# Patient Record
Sex: Male | Born: 1970 | Race: White | Hispanic: No | Marital: Married | Smoking: Current every day smoker
Health system: Southern US, Community
[De-identification: ages and names within clinical notes are randomized; demographics above are authoritative.]

## PROBLEM LIST (undated history)

## (undated) HISTORY — PX: KNEE SURGERY: SHX244

## (undated) HISTORY — PX: OTHER SURGICAL HISTORY: SHX169

---

## 2001-01-05 ENCOUNTER — Encounter: Payer: Self-pay | Admitting: Emergency Medicine

## 2001-01-05 ENCOUNTER — Emergency Department (HOSPITAL_COMMUNITY): Admission: EM | Admit: 2001-01-05 | Discharge: 2001-01-05 | Payer: Self-pay | Admitting: Emergency Medicine

## 2001-07-21 ENCOUNTER — Emergency Department (HOSPITAL_COMMUNITY): Admission: EM | Admit: 2001-07-21 | Discharge: 2001-07-21 | Payer: Self-pay

## 2002-06-02 ENCOUNTER — Emergency Department (HOSPITAL_COMMUNITY): Admission: EM | Admit: 2002-06-02 | Discharge: 2002-06-02 | Payer: Self-pay | Admitting: Emergency Medicine

## 2002-10-28 ENCOUNTER — Emergency Department (HOSPITAL_COMMUNITY): Admission: EM | Admit: 2002-10-28 | Discharge: 2002-10-28 | Payer: Self-pay

## 2003-07-06 ENCOUNTER — Inpatient Hospital Stay (HOSPITAL_COMMUNITY): Admission: EM | Admit: 2003-07-06 | Discharge: 2003-07-10 | Payer: Self-pay | Admitting: Psychiatry

## 2004-12-22 ENCOUNTER — Emergency Department (HOSPITAL_COMMUNITY): Admission: EM | Admit: 2004-12-22 | Discharge: 2004-12-22 | Payer: Self-pay | Admitting: Emergency Medicine

## 2006-11-18 ENCOUNTER — Emergency Department (HOSPITAL_COMMUNITY): Admission: EM | Admit: 2006-11-18 | Discharge: 2006-11-19 | Payer: Self-pay | Admitting: Emergency Medicine

## 2008-03-13 ENCOUNTER — Emergency Department (HOSPITAL_COMMUNITY): Admission: EM | Admit: 2008-03-13 | Discharge: 2008-03-13 | Payer: Self-pay | Admitting: Emergency Medicine
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
## 2008-11-08 ENCOUNTER — Ambulatory Visit (HOSPITAL_COMMUNITY): Admission: RE | Admit: 2008-11-08 | Discharge: 2008-11-08 | Payer: Self-pay | Admitting: Orthopedic Surgery

## 2009-09-14 ENCOUNTER — Emergency Department (HOSPITAL_COMMUNITY): Admission: EM | Admit: 2009-09-14 | Discharge: 2009-09-14 | Payer: Self-pay | Admitting: Emergency Medicine

## 2010-09-11 LAB — BASIC METABOLIC PANEL
BUN: 9 mg/dL (ref 6–23)
Chloride: 104 mEq/L (ref 96–112)
GFR calc non Af Amer: >60 mL/min (ref >60)
Potassium: 4.7 mEq/L (ref 3.5–5.1)
Sodium: 141 mEq/L (ref 135–145)

## 2010-09-11 LAB — DIFFERENTIAL
Eosinophils Absolute: 0.1 10*3/uL (ref 0.0–0.7)
Eosinophils Relative: 1 % (ref 0–5)
Lymphocytes Relative: 24 % (ref 12–46)
Lymphs Abs: 1.9 10*3/uL (ref 0.7–4.0)
Monocytes Absolute: 0.6 10*3/uL (ref 0.1–1.0)
Monocytes Relative: 7 % (ref 3–12)

## 2010-09-11 LAB — URINALYSIS, ROUTINE W REFLEX MICROSCOPIC
Glucose, UA: NEGATIVE mg/dL
Hgb urine dipstick: NEGATIVE
Protein, ur: NEGATIVE mg/dL
Specific Gravity, Urine: 1.01 (ref 1.005–1.030)
pH: 6 (ref 5.0–8.0)

## 2010-09-11 LAB — CBC
HCT: 48.1 % (ref 39.0–52.0)
Hemoglobin: 16.7 g/dL (ref 13.0–17.0)
MCV: 89.7 fL (ref 78.0–100.0)
Platelets: 204 10*3/uL (ref 150–400)
WBC: 7.9 10*3/uL (ref 4.0–10.5)
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
## 2011-03-21 LAB — DIFFERENTIAL
Eosinophils Relative: 1
Lymphocytes Relative: 23
Lymphs Abs: 2.7
Monocytes Relative: 5
Neutrophils Relative %: 71

## 2011-03-21 LAB — BASIC METABOLIC PANEL
BUN: 4 — ABNORMAL LOW
Chloride: 105
GFR calc Af Amer: >60
GFR calc non Af Amer: >60
Potassium: 3.3 — ABNORMAL LOW

## 2011-03-21 LAB — CBC
HCT: 41.4
Platelets: 258
RBC: 4.68
WBC: 11.4 — ABNORMAL HIGH

## 2011-03-21 LAB — ETHANOL: Alcohol, Ethyl (B): 131 — ABNORMAL HIGH

## 2012-05-09 ENCOUNTER — Encounter (HOSPITAL_COMMUNITY): Payer: Self-pay | Admitting: *Deleted

## 2012-05-09 ENCOUNTER — Emergency Department (HOSPITAL_COMMUNITY)
Admission: EM | Admit: 2012-05-09 | Discharge: 2012-05-09 | Disposition: A | Discharge status: Home / Self Care | Payer: Self-pay | Attending: Emergency Medicine | Admitting: Emergency Medicine

## 2012-05-09 DIAGNOSIS — F17200 Nicotine dependence, unspecified, uncomplicated: Secondary | ICD-10-CM | POA: Insufficient documentation

## 2012-05-09 DIAGNOSIS — R599 Enlarged lymph nodes, unspecified: Secondary | ICD-10-CM | POA: Insufficient documentation

## 2012-05-09 DIAGNOSIS — Z8781 Personal history of (healed) traumatic fracture: Secondary | ICD-10-CM | POA: Insufficient documentation

## 2012-05-09 DIAGNOSIS — M542 Cervicalgia: Secondary | ICD-10-CM | POA: Insufficient documentation

## 2012-05-09 DIAGNOSIS — Z79899 Other long term (current) drug therapy: Secondary | ICD-10-CM | POA: Insufficient documentation

## 2012-05-09 DIAGNOSIS — R590 Localized enlarged lymph nodes: Secondary | ICD-10-CM

## 2012-05-09 MED ORDER — HYDROCODONE-ACETAMINOPHEN 5-325 MG PO TABS: 1.0000 | ORAL_TABLET | Freq: Four times a day (QID) | ORAL | Status: AC | PRN

## 2012-05-09 NOTE — ED Notes (Signed)
Headache for 3-4 days, and "3 knots on back of neck",  No NV.  "a little diarrhea".  No HI,  No fever.

## 2012-05-09 NOTE — ED Provider Notes (Signed)
History     CSN: [id number]  Arrival date & time 05/09/12  1004   First MD Initiated Contact with Patient 05/09/12 1119      Chief Complaint  Patient presents with  . Headache    (Consider location/radiation/quality/duration/timing/severity/associated sxs/prior treatment) HPI Comments: Notes 3 swollen lymph nodes in L post neck and L sided headache since yest.  No fever or chills.  No trauma.    No signs of infection.  Patient is a 41 y.o. male presenting with headaches. The history is provided by the patient. No language interpreter was used.  Headache  The current episode started yesterday. The problem occurs constantly. The problem has not changed since onset.Associated with: swollen glands. The quality of the pain is described as throbbing. The pain is moderate. Radiates to: peri-auricular area. Pertinent negatives include no fever. He has tried acetaminophen and NSAIDs for the symptoms. The treatment provided no relief.    History reviewed. No pertinent past medical history.  Past Surgical History  Procedure Date  . Fracture clavicle surgery     History reviewed. No pertinent family history.  History  Substance Use Topics  . Smoking status: Current Every Day Smoker  . Smokeless tobacco: Not on file  . Alcohol Use: No      Review of Systems  Constitutional: Negative for fever and chills.  HENT: Positive for neck pain.   Neurological: Positive for headaches.  All other systems reviewed and are negative.    Allergies  Codeine  Home Medications   Current Outpatient Rx  Name  Route  Sig  Dispense  Refill  . ACETAMINOPHEN 500 MG PO TABS   Oral   Take 1,000 mg by mouth every 6 (six) hours as needed. Pain.         . IBUPROFEN 200 MG PO TABS   Oral   Take 800 mg by mouth every 6 (six) hours as needed. Pain.         Marland Kitchen HYDROCODONE-ACETAMINOPHEN 5-325 MG PO TABS   Oral   Take 1 tablet by mouth every 6 (six) hours as needed for pain.   12 tablet   0      BP 115/80  Pulse 89  Temp 98.2 F (36.8 C) (Oral)  Resp 18  Ht 6' (1.829 m)  Wt 185 lb (83.915 kg)  BMI 25.09 kg/m2  SpO2 100%  Physical Exam  Nursing note and vitals reviewed. Constitutional: He is oriented to person, place, and time. Vital signs are normal. He appears well-developed and well-nourished. He is cooperative.  Non-toxic appearance. He does not have a sickly appearance. He does not appear ill. No distress.  HENT:  Head: Normocephalic and atraumatic.    Right Ear: Hearing, tympanic membrane, external ear and ear canal normal.  Left Ear: Hearing, tympanic membrane, external ear and ear canal normal.  Eyes: EOM are normal.  Neck: Normal range of motion.  Cardiovascular: Normal rate, regular rhythm, normal heart sounds and intact distal pulses.   Pulmonary/Chest: Effort normal and breath sounds normal. No respiratory distress.  Abdominal: Soft. He exhibits no distension. There is no tenderness.  Musculoskeletal: Normal range of motion.  Lymphadenopathy:    He has cervical adenopathy.       Right cervical: No posterior cervical adenopathy present.      Left cervical: Posterior cervical adenopathy present.  Neurological: He is alert and oriented to person, place, and time.  Skin: Skin is warm and dry.  Psychiatric: He has a normal mood and  affect. Judgment normal.    ED Course  Procedures (including critical care time)  Labs Reviewed - No data to display No results found.   1. Lymphadenopathy, cervical       MDM  rx-hydrocodone, 12 No abx at present F/u with [facility] if sxs worsen.        Evalina Field, [place] 05/09/12 1212

## 2012-05-09 NOTE — ED Provider Notes (Signed)
Medical screening examination/treatment/procedure(s) were performed by non-physician practitioner and as supervising physician I was immediately available for consultation/collaboration.  Lucielle Vokes, MD 05/09/12 1438 

## 2016-02-26 ENCOUNTER — Encounter (HOSPITAL_COMMUNITY): Payer: Self-pay | Admitting: Emergency Medicine

## 2016-02-26 ENCOUNTER — Emergency Department (HOSPITAL_COMMUNITY)
Admission: EM | Admit: 2016-02-26 | Discharge: 2016-02-27 | Disposition: A | Discharge status: Home / Self Care | Payer: Self-pay | Attending: Emergency Medicine | Admitting: Emergency Medicine

## 2016-02-26 DIAGNOSIS — Y939 Activity, unspecified: Secondary | ICD-10-CM | POA: Insufficient documentation

## 2016-02-26 DIAGNOSIS — W57XXXA Bitten or stung by nonvenomous insect and other nonvenomous arthropods, initial encounter: Secondary | ICD-10-CM | POA: Insufficient documentation

## 2016-02-26 DIAGNOSIS — R1030 Lower abdominal pain, unspecified: Secondary | ICD-10-CM | POA: Insufficient documentation

## 2016-02-26 DIAGNOSIS — Y92009 Unspecified place in unspecified non-institutional (private) residence as the place of occurrence of the external cause: Secondary | ICD-10-CM | POA: Insufficient documentation

## 2016-02-26 DIAGNOSIS — L0291 Cutaneous abscess, unspecified: Secondary | ICD-10-CM

## 2016-02-26 DIAGNOSIS — Y999 Unspecified external cause status: Secondary | ICD-10-CM | POA: Insufficient documentation

## 2016-02-26 DIAGNOSIS — F17210 Nicotine dependence, cigarettes, uncomplicated: Secondary | ICD-10-CM | POA: Insufficient documentation

## 2016-02-26 DIAGNOSIS — L02416 Cutaneous abscess of left lower limb: Secondary | ICD-10-CM | POA: Insufficient documentation

## 2016-02-26 DIAGNOSIS — L03116 Cellulitis of left lower limb: Secondary | ICD-10-CM | POA: Insufficient documentation

## 2016-02-26 DIAGNOSIS — R110 Nausea: Secondary | ICD-10-CM | POA: Insufficient documentation

## 2016-02-26 LAB — CBC WITH DIFFERENTIAL/PLATELET
BASOS PCT: 0 %
Basophils Absolute: 0 10*3/uL (ref 0.0–0.1)
EOS ABS: 0 10*3/uL (ref 0.0–0.7)
Eosinophils Relative: 0 %
HCT: 41.9 % (ref 39.0–52.0)
HEMOGLOBIN: 14.8 g/dL (ref 13.0–17.0)
LYMPHS ABS: 2.1 10*3/uL (ref 0.7–4.0)
Lymphocytes Relative: 15 %
MCH: 30.2 pg (ref 26.0–34.0)
MCHC: 35.3 g/dL (ref 30.0–36.0)
MCV: 85.5 fL (ref 78.0–100.0)
MONO ABS: 1.3 10*3/uL — AB (ref 0.1–1.0)
MONOS PCT: 9 %
Neutro Abs: 10.9 10*3/uL — ABNORMAL HIGH (ref 1.7–7.7)
Neutrophils Relative %: 76 %
Platelets: 226 10*3/uL (ref 150–400)
RBC: 4.9 MIL/uL (ref 4.22–5.81)
RDW: 12.4 % (ref 11.5–15.5)
WBC: 14.4 10*3/uL — ABNORMAL HIGH (ref 4.0–10.5)

## 2016-02-26 LAB — BASIC METABOLIC PANEL
Anion gap: 6 (ref 5–15)
BUN: 12 mg/dL (ref 6–20)
CALCIUM: 9.1 mg/dL (ref 8.9–10.3)
CHLORIDE: 105 mmol/L (ref 101–111)
CO2: 25 mmol/L (ref 22–32)
CREATININE: 1.18 mg/dL (ref 0.61–1.24)
GFR calc non Af Amer: >60 mL/min (ref >60)
Glucose, Bld: 88 mg/dL (ref 65–99)
Potassium: 3.8 mmol/L (ref 3.5–5.1)
SODIUM: 136 mmol/L (ref 135–145)

## 2016-02-26 LAB — HEPATIC FUNCTION PANEL
ALK PHOS: 97 U/L (ref 38–126)
ALT: 34 U/L (ref 17–63)
AST: 21 U/L (ref 15–41)
Albumin: 4.6 g/dL (ref 3.5–5.0)
Total Bilirubin: 0.5 mg/dL (ref 0.3–1.2)
Total Protein: 7.6 g/dL (ref 6.5–8.1)

## 2016-02-26 LAB — LIPASE, BLOOD: LIPASE: 19 U/L (ref 11–51)

## 2016-02-26 LAB — URINALYSIS, ROUTINE W REFLEX MICROSCOPIC
BILIRUBIN URINE: NEGATIVE
GLUCOSE, UA: NEGATIVE mg/dL
HGB URINE DIPSTICK: NEGATIVE
Ketones, ur: NEGATIVE mg/dL
Leukocytes, UA: NEGATIVE
Nitrite: NEGATIVE
Protein, ur: NEGATIVE mg/dL
SPECIFIC GRAVITY, URINE: 1.015 (ref 1.005–1.030)
pH: 7 (ref 5.0–8.0)

## 2016-02-26 MED ORDER — KETOROLAC TROMETHAMINE 30 MG/ML IJ SOLN
15.0000 mg | Freq: Once | INTRAMUSCULAR | Status: AC
  Administered 2016-02-27: 15 mg via INTRAVENOUS
  Filled 2016-02-26: qty 1

## 2016-02-26 MED ORDER — MORPHINE SULFATE (PF) 4 MG/ML IV SOLN
4.0000 mg | Freq: Once | INTRAVENOUS | Status: AC
  Administered 2016-02-26: 4 mg via INTRAVENOUS
  Filled 2016-02-26: qty 1

## 2016-02-26 MED ORDER — CLINDAMYCIN PHOSPHATE 600 MG/50ML IV SOLN
600.0000 mg | Freq: Once | INTRAVENOUS | Status: AC
  Administered 2016-02-26: 600 mg via INTRAVENOUS
  Filled 2016-02-26: qty 50

## 2016-02-26 MED ORDER — MORPHINE SULFATE (PF) 4 MG/ML IV SOLN
4.0000 mg | Freq: Once | INTRAVENOUS | Status: AC
  Administered 2016-02-27: 4 mg via INTRAVENOUS
  Filled 2016-02-26: qty 1

## 2016-02-26 MED ORDER — ONDANSETRON HCL 4 MG/2ML IJ SOLN
4.0000 mg | Freq: Once | INTRAMUSCULAR | Status: AC
  Administered 2016-02-26: 4 mg via INTRAVENOUS
  Filled 2016-02-26: qty 2

## 2016-02-26 MED ORDER — LIDOCAINE HCL (PF) 1 % IJ SOLN
5.0000 mL | Freq: Once | INTRAMUSCULAR | Status: AC
  Administered 2016-02-27: 5 mL
  Filled 2016-02-26: qty 5

## 2016-02-26 NOTE — ED Triage Notes (Signed)
Pt reports an insect bite oon his L leg. Pt states he has had fever, SOB, generalized body aches and pain. L leg edematous with reddened area.

## 2016-02-27 MED ORDER — CLINDAMYCIN HCL 150 MG PO CAPS
450.0000 mg | ORAL_CAPSULE | Freq: Three times a day (TID) | ORAL | 0 refills | Status: AC
Start: 2016-02-27 — End: 2016-03-05

## 2016-02-27 MED ORDER — HYDROCODONE-ACETAMINOPHEN 5-325 MG PO TABS: 1.0000 | ORAL_TABLET | ORAL | 0 refills | Status: DC | PRN

## 2016-02-27 NOTE — ED Provider Notes (Signed)
[facility]-EMERGENCY DEPT Provider Note   CSN: [id number] Arrival date & time: 02/26/16  1944     History   Chief Complaint Chief Complaint  Patient presents with  . Insect Bite    HPI Rodney Nguyen is a 45 y.o. male presenting with an infected insect bite on his left lower leg.  He went to take a nap in a room in their home undergoing remodeling, describes having some floor boards missing so open to the crawl space.  He got in the covers when he felt something bite his left leg, was immediately itchy but not painful, after which he has developed increasing pain, swelling, redness and subjective fevers in addition to nausea without emesis and lower abdominal cramping along with generalized myalgia.  His left lower leg and ankle feels hot and is swollen.  He has had no drainage from the wound site but feels there is an abscess.  He has taken ibuprofen without relief of sx.   The history is provided by the patient and the spouse.    History reviewed. No pertinent past medical history.  There are no active problems to display for this patient.   Past Surgical History:  Procedure Laterality Date  . fracture clavicle surgery    . KNEE SURGERY         Home Medications    Prior to Admission medications   Medication Sig Start Date End Date Taking? Authorizing Provider  acetaminophen (TYLENOL) 500 MG tablet Take 1,000 mg by mouth every 6 (six) hours as needed. Pain.    Historical Provider, MD  clindamycin (CLEOCIN) 150 MG capsule Take 3 capsules (450 mg total) by mouth 3 (three) times daily. 02/27/16 03/05/16  Burgess Amor, PA-C  HYDROcodone-acetaminophen (NORCO/VICODIN) 5-325 MG tablet Take 1 tablet by mouth every 4 (four) hours as needed. 02/27/16   Burgess Amor, PA-C  ibuprofen (ADVIL,MOTRIN) 200 MG tablet Take 800 mg by mouth every 6 (six) hours as needed. Pain.    Historical Provider, MD    Family History History reviewed. No pertinent family history.  Social History Social History    Substance Use Topics  . Smoking status: Current Every Day Smoker    Packs/day: 1.00    Types: Cigarettes  . Smokeless tobacco: Never Used  . Alcohol use No     Allergies   Codeine   Review of Systems Review of Systems  Constitutional: Positive for fever. Negative for chills.  Respiratory: Negative for shortness of breath and wheezing.   Gastrointestinal: Positive for abdominal pain and nausea.  Musculoskeletal: Positive for myalgias.  Skin: Positive for color change and wound.  Neurological: Negative for numbness.     Physical Exam Updated Vital Signs BP 117/76   Pulse 74   Temp 99.4 F (37.4 C) (Oral)   Resp 22   Ht 5\' 11"  (1.803 m)   Wt 83.9 kg   SpO2 97%   BMI 25.80 kg/m   Physical Exam  Constitutional: He appears well-developed and well-nourished. No distress.  HENT:  Head: Normocephalic.  Eyes: Conjunctivae are normal.  Cardiovascular: Normal rate and regular rhythm.   Pulses:      Dorsalis pedis pulses are 2+ on the right side, and 2+ on the left side.  Pulmonary/Chest: Effort normal.  Abdominal: Soft. He exhibits no distension. There is no tenderness. There is no guarding.  Musculoskeletal: Normal range of motion. He exhibits edema and tenderness.  Skin: Skin is warm. Capillary refill takes less than 2 seconds. There is erythema.  Localized intense erythema and brawny edema left lateral lower leg.  TTP,  Surrounding non pitting edema and mild erythema to left lateral and anterior ankle.        ED Treatments / Results  Labs (all labs ordered are listed, but only abnormal results are displayed) Labs Reviewed  CBC WITH DIFFERENTIAL/PLATELET - Abnormal; Notable for the following:       Result Value   WBC 14.4 (*)    Neutro Abs 10.9 (*)    Monocytes Absolute 1.3 (*)    All other components within normal limits  HEPATIC FUNCTION PANEL - Abnormal; Notable for the following:    Bilirubin, Direct <0.1 (*)    All other components within normal  limits  AEROBIC CULTURE (SUPERFICIAL SPECIMEN)  BASIC METABOLIC PANEL  LIPASE, BLOOD  URINALYSIS, ROUTINE W REFLEX MICROSCOPIC (NOT AT [facility])  CBC WITH DIFFERENTIAL/PLATELET    EKG  EKG Interpretation None       Radiology No results found.  Procedures Procedures (including critical care time)  INCISION AND DRAINAGE Performed by: Burgess Amor Consent: Verbal consent obtained. Risks and benefits: risks, benefits and alternatives were discussed Type: abscess  Body area: left lower leg  Anesthesia: local infiltration  Incision was made with a scalpel.  Local anesthetic: lidocaine 1% without epinephrine  Anesthetic total: 5 ml  Complexity: complex Blunt dissection to break up loculations  Drainage: purulent  Drainage amount: moderate  Packing material: none  Patient tolerance: Patient tolerated the procedure well with no immediate complications.     Medications Ordered in ED Medications  morphine 4 MG/ML injection 4 mg (4 mg Intravenous Given 02/26/16 2202)  ondansetron (ZOFRAN) injection 4 mg (4 mg Intravenous Given 02/26/16 2202)  lidocaine (PF) (XYLOCAINE) 1 % injection 5 mL (5 mLs Other Given by Other 02/27/16 0027)  clindamycin (CLEOCIN) IVPB 600 mg (0 mg Intravenous Stopped 02/27/16 0016)  morphine 4 MG/ML injection 4 mg (4 mg Intravenous Given 02/27/16 0008)  ketorolac (TORADOL) 30 MG/ML injection 15 mg (15 mg Intravenous Given 02/27/16 0008)     Initial Impression / Assessment and Plan / ED Course  I have reviewed the triage vital signs and the nursing notes.  Pertinent labs & imaging results that were available during my care of the patient were reviewed by me and considered in my medical decision making (see chart for details).  Clinical Course    Pt was given IV clindamycin here, will start on oral at home in the morning,  Hydrocodone.  Advised heat tx,  Elevation, warms epsom salt soaks bid.  Recheck here for any worsened sx or if not responding  within 48 hours to the abx.  Wound is now 27 days old,  Doubt brown recluse given no ulceration or necrosis.  Labs normal except leukocytosis.    The patient appears reasonably screened and/or stabilized for discharge and I doubt any other medical condition or other [facility] requiring further screening, evaluation, or treatment in the ED at this time prior to discharge.   Final Clinical Impressions(s) / ED Diagnoses   Final diagnoses:  Cellulitis of left lower extremity  Abscess    New Prescriptions Discharge Medication List as of 02/27/2016  1:09 AM    START taking these medications   Details  clindamycin (CLEOCIN) 150 MG capsule Take 3 capsules (450 mg total) by mouth 3 (three) times daily., Starting Mon 02/27/2016, Until Mon 03/05/2016, Print    HYDROcodone-acetaminophen (NORCO/VICODIN) 5-325 MG tablet Take 1 tablet by mouth every 4 (four) hours  as needed., Starting Mon 02/27/2016, Print         Burgess AmorJulie Rio Kidane, PA-C 02/27/16 0155    Lavera Guiseana Duo Liu, MD 02/27/16 [phone]

## 2016-03-01 LAB — AEROBIC CULTURE  (SUPERFICIAL SPECIMEN)

## 2016-03-01 LAB — AEROBIC CULTURE W GRAM STAIN (SUPERFICIAL SPECIMEN)

## 2016-03-02 ENCOUNTER — Telehealth (HOSPITAL_COMMUNITY): Payer: Self-pay

## 2016-03-02 NOTE — Telephone Encounter (Signed)
Post ED Visit - Positive Culture Follow-up  Culture report reviewed by antimicrobial stewardship pharmacist:  []  Enzo BiNathan Batchelder, Pharm.D. []  Celedonio MiyamotoJeremy Frens, Pharm.D., BCPS []  Garvin FilaMike Maccia, Pharm.D. []  Georgina PillionElizabeth Martin, Pharm.D., BCPS []  [place] Holly SpringsMinh Pham, [address] BoulevardPharm.D., BCPS, AAHIVP []  Estella HuskMichelle Turner, Pharm.D., BCPS, AAHIVP []  Cassie Stewart, Pharm.D. []  Sherle Poeob Vincent, [address]Pharm[address] Gertie GowdaX  Emily Stewart, Pharm.D.  Positive wound culture, moderate MRSA Treated with clindamycin, organism sensitive to the same and no further patient follow-up is required at this time.  Rodney RightClark, Rodney Nguyen Rodney Nguyen 03/02/2016, 1:01 PM

## 2016-05-05 ENCOUNTER — Encounter (HOSPITAL_COMMUNITY): Payer: Self-pay | Admitting: Emergency Medicine

## 2016-05-05 ENCOUNTER — Emergency Department (HOSPITAL_COMMUNITY)
Admission: EM | Admit: 2016-05-05 | Discharge: 2016-05-05 | Disposition: A | Discharge status: Other Acute Inpatient Hospital | Payer: Self-pay | Attending: Emergency Medicine | Admitting: Emergency Medicine

## 2016-05-05 DIAGNOSIS — F17210 Nicotine dependence, cigarettes, uncomplicated: Secondary | ICD-10-CM | POA: Insufficient documentation

## 2016-05-05 DIAGNOSIS — F1410 Cocaine abuse, uncomplicated: Secondary | ICD-10-CM | POA: Insufficient documentation

## 2016-05-05 DIAGNOSIS — R45851 Suicidal ideations: Secondary | ICD-10-CM

## 2016-05-05 DIAGNOSIS — Z79899 Other long term (current) drug therapy: Secondary | ICD-10-CM | POA: Insufficient documentation

## 2016-05-05 LAB — BASIC METABOLIC PANEL
Anion gap: 10 (ref 5–15)
BUN: 8 mg/dL (ref 6–20)
CALCIUM: 9.1 mg/dL (ref 8.9–10.3)
CO2: 24 mmol/L (ref 22–32)
CREATININE: 0.93 mg/dL (ref 0.61–1.24)
Chloride: 106 mmol/L (ref 101–111)
GFR calc Af Amer: >60 mL/min (ref >60)
Glucose, Bld: 124 mg/dL — ABNORMAL HIGH (ref 65–99)
Potassium: 3.4 mmol/L — ABNORMAL LOW (ref 3.5–5.1)
SODIUM: 140 mmol/L (ref 135–145)

## 2016-05-05 LAB — CBC
HCT: 43.6 % (ref 39.0–52.0)
Hemoglobin: 15.6 g/dL (ref 13.0–17.0)
MCH: 31 pg (ref 26.0–34.0)
MCHC: 35.8 g/dL (ref 30.0–36.0)
MCV: 86.5 fL (ref 78.0–100.0)
PLATELETS: 217 10*3/uL (ref 150–400)
RBC: 5.04 MIL/uL (ref 4.22–5.81)
RDW: 13.8 % (ref 11.5–15.5)
WBC: 10.4 10*3/uL (ref 4.0–10.5)

## 2016-05-05 LAB — RAPID URINE DRUG SCREEN, HOSP PERFORMED
Amphetamines: NOT DETECTED
Barbiturates: NOT DETECTED
Benzodiazepines: NOT DETECTED
Cocaine: POSITIVE — AB
Opiates: NOT DETECTED
Tetrahydrocannabinol: NOT DETECTED

## 2016-05-05 LAB — ETHANOL: ALCOHOL ETHYL (B): 172 mg/dL — AB (ref <5)

## 2016-05-05 MED ORDER — ZOLPIDEM TARTRATE 5 MG PO TABS: 5.0000 mg | ORAL_TABLET | Freq: Every evening | ORAL | Status: DC | PRN

## 2016-05-05 MED ORDER — ALUM & MAG HYDROXIDE-SIMETH 200-200-20 MG/5ML PO SUSP: 30.0000 mL | ORAL | Status: DC | PRN

## 2016-05-05 MED ORDER — ONDANSETRON HCL 4 MG PO TABS: 4.0000 mg | ORAL_TABLET | Freq: Three times a day (TID) | ORAL | Status: DC | PRN

## 2016-05-05 MED ORDER — IBUPROFEN 400 MG PO TABS: 600.0000 mg | ORAL_TABLET | Freq: Three times a day (TID) | ORAL | Status: DC | PRN

## 2016-05-05 MED ORDER — ACETAMINOPHEN 325 MG PO TABS: 650.0000 mg | ORAL_TABLET | ORAL | Status: DC | PRN

## 2016-05-05 MED ORDER — NICOTINE 21 MG/24HR TD PT24
21.0000 mg | MEDICATED_PATCH | Freq: Every day | TRANSDERMAL | Status: DC
  Filled 2016-05-05: qty 1

## 2016-05-05 NOTE — Consult Note (Signed)
Telepsych Consultation   Reason for Consult:  Reports of suicidal ideation by wife  Referring Physician:  EDP Patient Identification: Rodney Nguyen MRN:  [id number] Principal Diagnosis: Cocaine abuse Rule out Bipolar Disorder  Diagnosis:   Patient Active Problem List   Diagnosis Date Noted  . Cocaine abuse [F14.10] 05/05/2016    Total Time spent with patient: 20 minutes  Subjective:   Rodney Nguyen is a 45 y.o. male patient admitted under IVC after wife reported that he is a danger to self and others.   HPI:    Per initial Tele Assessment Note:   Rodney Nguyen is an 45 y.o. male who presents to the ED under IVC by his wife. When asked why the pt felt he was at the hospital, he stated "because my wife is manipulative and she had me committed because she don't want me going out with the boys." Pt stated he has been arguing with his wife "for several weeks" leading to the IVC. Pt stated "I love life and I don't want to hurt myself or anyone else." Pt denies the allegations that he held a knife to his wrist and threatened suicide. Pt reports recent drug use and a past history of a suicide attempt in 2012 in which the pt stated "life in general" was stressor. During the assessment the pt appeared disheveled with rapid face movements and restlessness.  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 Pt reports he feels he "takes on too many tasks" at work and becomes overwhelmed. Pt sat up in the bed during the assessment and stated "I know what it's like to be hungry as a child. My dad got sick and my mom had to take care of [place]. I don't like to turn down work." Pt reports he has trouble sleeping because he has restless thoughts and his mind races "about work."   Patient seen for am psych evaluation on the morning of 05/05/2016. The patient refutes all information contained in the IVC. He denies any suicidal or homicidal  ideation. Patient states "My wife and I have been arguing. She has become more controlling of me. I'm not really sure why she said all those things. I have not made any suicidal statements. I use cocaine on occasion. I drink a few times a week. I would not say I have a substance abuse problem. I had a suicide attempt years ago when I ate some poison and was admitted. I'm not sure what diagnosis that I was ever given. No I don't recall ever having a manic episode before. I have a 80 year old daughter and a 1 year old one. I have been stressed from work but not to the point that I would hurt myself. It's just been a rough run lately. I don't take any medications." Rodney Nguyen was calm and pleasant during the assessment. He appeared to be future oriented and goal directed. He continues to refute the information contained in the IVC. Patient denies any recent symptoms of depression including decreased appetite or neglect of personal appearance as reported by his wife. Patient states "She is psycho and needs to be here." Discussed with Dr. De Nurse who due to history of suicide attempt and information provided by his wife feels at this time the patient should be admitted inpatient for further evaluation. The patient is currently under IVC.   Past Psychiatric  History: Wife reports Bipolar Disorder but patient denies   Risk to Self: Suicidal Ideation: No Suicidal Intent: No Is patient at risk for suicide?: No Suicidal Plan?: No Access to Means: No What has been your use of drugs/alcohol within the last 12 months?: reports to using cocaine yesterday How many times?: 1 Triggers for Past Attempts: Other (Comment) (Pt reports "life in general") Intentional Self Injurious Behavior: None Risk to Others: Homicidal Ideation: No Thoughts of Harm to Others: No Current Homicidal Intent: No Current Homicidal Plan: No Access to Homicidal Means: No History of harm to others?: Yes Assessment of Violence: In distant  past Violent Behavior Description: pt reports he was in prison for assault in 2008 but did not provide the details of the assault Does patient have access to weapons?: No Criminal Charges Pending?: No Does patient have a court date: No Prior Inpatient Therapy: Prior Inpatient Therapy: Yes Prior Therapy Dates: 1991, 2005, 2012 Prior Therapy Facilty/Provider(s): [place], [facility], [facility] Reason for Treatment: SI, Bipolar Prior Outpatient Therapy: Prior Outpatient Therapy: No Does patient have an ACCT team?: No Does patient have Intensive In-House Services?  : No Does patient have [facility] services? : No Does patient have P4CC services?: No  Past Medical History: History reviewed. No pertinent past medical history.  Past Surgical History:  Procedure Laterality Date  . fracture clavicle surgery    . KNEE SURGERY     Family History: History reviewed. No pertinent family history. Family Psychiatric  History: Denies Social History:  History  Alcohol Use No     History  Drug Use  . Types: Cocaine    Social History   Social History  . Marital status: Married    Spouse name: N/A  . Number of children: N/A  . Years of education: N/A   Social History Main Topics  . Smoking status: Current Every Day Smoker    Packs/day: 1.00    Types: Cigarettes  . Smokeless tobacco: Never Used  . Alcohol use No  . Drug use:     Types: Cocaine  . Sexual activity: Not Asked   Other Topics Concern  . None   Social History Narrative  . None   Additional Social History:    Allergies:   Allergies  Allergen Reactions  . Codeine Nausea Only    Labs:  Results for orders placed or performed during the hospital encounter of 05/05/16 (from the past 48 hour(s))  Rapid urine drug screen (hospital performed)     Status: Abnormal   Collection Time: 05/05/16  3:35 AM  Result Value Ref Range   Opiates NONE DETECTED NONE DETECTED   Cocaine POSITIVE (A) NONE DETECTED   Benzodiazepines NONE DETECTED  NONE DETECTED   Amphetamines NONE DETECTED NONE DETECTED   Tetrahydrocannabinol NONE DETECTED NONE DETECTED   Barbiturates NONE DETECTED NONE DETECTED    Comment:        DRUG SCREEN FOR MEDICAL PURPOSES ONLY.  IF CONFIRMATION IS NEEDED FOR ANY PURPOSE, NOTIFY LAB WITHIN 5 DAYS.        LOWEST DETECTABLE LIMITS FOR URINE DRUG SCREEN Drug Class       Cutoff (ng/mL) Amphetamine      1000 Barbiturate      200 Benzodiazepine   357 Tricyclics       017 Opiates          300 Cocaine          300 THC  50   CBC     Status: None   Collection Time: 05/05/16  3:39 AM  Result Value Ref Range   WBC 10.4 4.0 - 10.5 K/uL   RBC 5.04 4.22 - 5.81 MIL/uL   Hemoglobin 15.6 13.0 - 17.0 g/dL   HCT 43.6 39.0 - 52.0 %   MCV 86.5 78.0 - 100.0 fL   MCH 31.0 26.0 - 34.0 pg   MCHC 35.8 30.0 - 36.0 g/dL   RDW 13.8 11.5 - 15.5 %   Platelets 217 150 - 400 K/uL  Basic metabolic panel     Status: Abnormal   Collection Time: 05/05/16  3:39 AM  Result Value Ref Range   Sodium 140 135 - 145 mmol/L   Potassium 3.4 (L) 3.5 - 5.1 mmol/L   Chloride 106 101 - 111 mmol/L   CO2 24 22 - 32 mmol/L   Glucose, Bld 124 (H) 65 - 99 mg/dL   BUN 8 6 - 20 mg/dL   Creatinine, Ser 0.93 0.61 - 1.24 mg/dL   Calcium 9.1 8.9 - 10.3 mg/dL   GFR calc non Af Amer >60 >60 mL/min   GFR calc Af Amer >60 >60 mL/min    Comment: (NOTE) The eGFR has been calculated using the CKD EPI equation. This calculation has not been validated in all clinical situations. eGFR's persistently <60 mL/min signify possible Chronic Kidney Disease.    Anion gap 10 5 - 15  Ethanol     Status: Abnormal   Collection Time: 05/05/16  3:39 AM  Result Value Ref Range   Alcohol, Ethyl (B) 172 (H) <5 mg/dL    Comment:        LOWEST DETECTABLE LIMIT FOR SERUM ALCOHOL IS 5 mg/dL FOR MEDICAL PURPOSES ONLY     Current Facility-Administered Medications  Medication Dose Route Frequency Provider Last Rate Last Dose  . acetaminophen (TYLENOL)  tablet 650 mg  650 mg Oral Q4H PRN Jola Schmidt, MD      . alum & mag hydroxide-simeth (MAALOX/MYLANTA) 200-200-20 MG/5ML suspension 30 mL  30 mL Oral PRN Jola Schmidt, MD      . ibuprofen (ADVIL,MOTRIN) tablet 600 mg  600 mg Oral Q8H PRN Jola Schmidt, MD      . nicotine (NICODERM CQ - dosed in mg/24 hours) patch 21 mg  21 mg Transdermal Daily Jola Schmidt, MD   Stopped at 05/05/16 1017  . ondansetron (ZOFRAN) tablet 4 mg  4 mg Oral Q8H PRN Jola Schmidt, MD      . zolpidem [facility]) tablet 5 mg  5 mg Oral QHS PRN Jola Schmidt, MD       Current Outpatient Prescriptions  Medication Sig Dispense Refill  . acetaminophen (TYLENOL) 500 MG tablet Take 1,000 mg by mouth every 6 (six) hours as needed. Pain.    Marland Kitchen ibuprofen (ADVIL,MOTRIN) 200 MG tablet Take 800 mg by mouth every 6 (six) hours as needed. Pain.    Marland Kitchen HYDROcodone-acetaminophen (NORCO/VICODIN) 5-325 MG tablet Take 1 tablet by mouth every 4 (four) hours as needed. (Patient not taking: Reported on 05/05/2016) 20 tablet 0    Musculoskeletal:  Unable to assess via camera   Psychiatric Specialty Exam: Physical Exam  Review of Systems  Psychiatric/Behavioral: Positive for substance abuse. Negative for depression, hallucinations, memory loss and suicidal ideas. The patient is not nervous/anxious and does not have insomnia.     Blood pressure 132/72, pulse 100, temperature 98 F (36.7 C), temperature source Oral, resp. rate 18, height 6' (1.829 m),  weight 81.6 kg (180 lb), SpO2 99 %.Body mass index is 24.41 kg/m.  General Appearance: Casual  Eye Contact:  Good  Speech:  Clear and Coherent  Volume:  Normal  Mood:  Euthymic  Affect:  Appropriate  Thought Process:  Coherent and Goal Directed  Orientation:  Full (Time, Place, and Person)  Thought Content:  WDL  Suicidal Thoughts:  No  Homicidal Thoughts:  No  Memory:  Immediate;   Good Recent;   Good Remote;   Good  Judgement:  Fair  Insight:  Fair  Psychomotor Activity:  Normal   Concentration:  Concentration: Good and Attention Span: Good  Recall:  Good  Fund of Knowledge:  Good  Language:  Good  Akathisia:  No  Handed:  Right  AIMS (if indicated):     Assets:  Communication Skills Desire for Improvement Housing Leisure Time Physical Health Resilience Social Support Talents/Skills  ADL's:  Intact  Cognition:  WNL  Sleep:        Treatment Plan Summary: Admit to psychiatric inpatient for further evaluation of reported suicidal ideation and depressive symptoms per the IVC paperwork initiated by his wife.   Disposition: Recommend psychiatric Inpatient admission when medically cleared. Supportive therapy provided about ongoing stressors. Discussed crisis plan, support from social network, calling 911, coming to the Emergency Department, and calling Suicide Hotline.  Elmarie Shiley, NP 05/05/2016 1:23 PM Agree with notes and plan.

## 2016-05-05 NOTE — ED Notes (Signed)
Karlene LinemanAquicha, [facility], states this pt needs a psych eval in the morning.

## 2016-05-05 NOTE — ED Triage Notes (Signed)
Per pt, wife was angry because he wanted to hang out with guys and took out paperwork on him.  Pt admits to using cocaine twice in the last 3 weeks, denies homicidal or suicidal thoughts.    IVC paperwork states that pt held knife to wrist, threaten suicide and has been using drugs to the point of neglecting personal hygeine

## 2016-05-05 NOTE — ED Provider Notes (Signed)
[facility] DEPT Provider Note   CSN: [id number] Arrival date & time: 05/05/16  0235     History   Chief Complaint Chief Complaint  Patient presents with  . V70.1    HPI Denyse DagoDavid W Strickland is a 45 y.o. male.  HPI Patient presents to the emergency department under involuntary commitment.  Involuntary commitment paperwork states that the patient has had increasing drug and alcohol use including crack cocaine and tonight threatened to kill himself by slitting his wrist.  It is reported to me by his wife that he was holding a knife and threatening to kill himself.  The wife reports she's had increasing suicidal thoughts and increasing substance abuse.  He previously had been sober for approximately a year and is back to using crack cocaine over the past 3-4 weeks.  He was hospitalized 5 years ago per the wife for a suicide attempt at which point he drank household chemicals.  [facility]  He has a history of bipolar disorder.  Wife reports is not compliant with his medications.  The patient reports he has no suicidal thoughts.  No patient reports that he did not hold a knife or threaten suicide and that he has not had any suicidal thoughts.  The patient reports no mental health illness.  He reports he does not have a history of bipolar disorder and is not supposed to be on any medications.  He does admit to alcohol and cocaine use over the past several weeks.  He believes his wife to get IVC paperwork because she was unhappy that he was "drinking with the boys tonight".  He cannot tell me a reason for why he was [facility].   History reviewed. No pertinent past medical history.  There are no active problems to display for this patient.   Past Surgical History:  Procedure Laterality Date  . fracture clavicle surgery    . KNEE SURGERY         Home Medications    Prior to Admission  medications   Medication Sig Start Date End Date Taking? Authorizing Provider  acetaminophen (TYLENOL) 500 MG tablet Take 1,000 mg by mouth every 6 (six) hours as needed. Pain.    Historical Provider, MD  HYDROcodone-acetaminophen (NORCO/VICODIN) 5-325 MG tablet Take 1 tablet by mouth every 4 (four) hours as needed. 02/27/16   Burgess AmorJulie Idol, PA-C  ibuprofen (ADVIL,MOTRIN) 200 MG tablet Take 800 mg by mouth every 6 (six) hours as needed. Pain.    Historical Provider, MD    Family History History reviewed. No pertinent family history.  Social History Social History  Substance Use Topics  . Smoking status: Current Every Day Smoker    Packs/day: 1.00    Types: Cigarettes  . Smokeless tobacco: Never Used  . Alcohol use No     Allergies   Codeine   Review of Systems Review of Systems  All other systems reviewed and are negative.    Physical Exam Updated Vital Signs BP (!) 130/49 (BP Location: Right Arm)   Pulse 81   Temp 97.6 F (36.4 C) (Oral)   Resp 18   Ht 6' (1.829 m)   Wt 180 lb (81.6 kg)   SpO2 96%   BMI 24.41 kg/m   Physical Exam  Constitutional: He is oriented to person, place, and time. He appears well-developed and well-nourished.  HENT:  Head: Normocephalic and atraumatic.  Eyes: EOM are normal.  Neck: Normal range of motion.  Cardiovascular: Normal rate, regular rhythm, normal heart sounds and intact distal pulses.   Pulmonary/Chest: Effort normal and breath sounds normal. No respiratory distress.  Abdominal: Soft. He exhibits no distension. There is no tenderness.  Musculoskeletal: Normal range of motion.  Neurological: He is alert and oriented to person, place, and time.  Skin: Skin is warm and dry.  Psychiatric: He has a normal mood and affect. His speech is normal and behavior is normal. Judgment normal. Thought content is not paranoid and not delusional.  Nursing note and vitals reviewed.    ED Treatments / Results  Labs (all labs ordered are  listed, but only abnormal results are displayed) Labs Reviewed  CBC  BASIC METABOLIC PANEL  ETHANOL  RAPID URINE DRUG SCREEN, HOSP PERFORMED    EKG  EKG Interpretation None       Radiology No results found.  Procedures Procedures (including critical care time)  Medications Ordered in ED Medications  ondansetron (ZOFRAN) tablet 4 mg (not administered)  nicotine (NICODERM CQ - dosed in mg/24 hours) patch 21 mg (not administered)  zolpidem (AMBIEN) tablet 5 mg (not administered)  ibuprofen (ADVIL,MOTRIN) tablet 600 mg (not administered)  acetaminophen (TYLENOL) tablet 650 mg (not administered)  alum & mag hydroxide-simeth (MAALOX/MYLANTA) 200-200-20 MG/5ML suspension 30 mL (not administered)     Initial Impression / Assessment and Plan / ED Course  I have reviewed the triage vital signs and the nursing notes.  Pertinent labs & imaging results that were available during my care of the patient were reviewed by me and considered in my medical decision making (see chart for details).  Clinical Course     I suspect the patient is not being truly honest with me.  I believe his wife more than I believe him.  Patient is under IVC.  These will be continued at this time.  TTS to evaluate.     Final Clinical Impressions(s) / ED Diagnoses   Final diagnoses:  None    New Prescriptions New Prescriptions   No medications on file     Azalia BilisKevin Arleene Settle, MD 05/05/16 [phone]

## 2016-05-05 NOTE — BH Assessment (Signed)
Tele Assessment Note   Rodney Nguyen is an 45 y.o. male who presents to the ED under IVC by his wife. When asked why the pt felt he was at the hospital, he stated "because my wife is manipulative and she had me committed because she don't want me going out with the boys." Pt stated he has been arguing with his wife "for several weeks" leading to the IVC. Pt stated "I love life and I don't want to hurt myself or anyone else." Pt denies the allegations that he held a knife to his wrist and threatened suicide. Pt reports recent drug use and a past history of a suicide attempt in 2012 in which the pt stated "life in general" was stressor. During the assessment the pt appeared disheveled with rapid face movements and restlessness.  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 Pt reports he feels he "takes on too many tasks" at work and becomes overwhelmed. Pt sat up in the bed during the assessment and stated "I know what it's like to be hungry as a child. My dad got sick and my mom had to take care of [place]. I don't like to turn down work." Pt reports he has trouble sleeping because he has restless thoughts and his mind races "about work."   Per wife: Rishard Delange [phone]; pt has been a danger to himself and others. Pt's wife reports she initiated the IVC because the pt told her he was going to kill himself and that he did not want to be alive anymore. Pt's wife reports the pt has a hx of Bipolar but does not take medication for the disorder due to a previous "pill addiction." Pt's wife reports she noticed a change in his behavior in the last month and a half and recently the pt has decreased grooming, neglects his job, goes days without eating, and makes suicidal threats.   Per Nira Conn, FNP pt will need an AM psych eval. Lawson Fiscal, RN and the pt's wife, at her request, have been notified of the disposition recommendation.    Diagnosis: Cocaine Use Disorder, pt reports hx of Bipolar   Past Medical History: History reviewed. No pertinent past medical history.  Past Surgical History:  Procedure Laterality Date   fracture clavicle surgery     KNEE SURGERY      Family History: History reviewed. No pertinent family history.  Social History:  reports that he has been smoking Cigarettes.  He has been smoking about 1.00 pack per day. He has never used smokeless tobacco. He reports that he uses drugs, including Cocaine. He reports that he does not drink alcohol.  Additional Social History:  Alcohol / Drug Use Pain Medications: Pt denies abuse  Prescriptions: Pt denies abuse  Over the Counter: Pt denies abuse  History of alcohol / drug use?: Yes Negative Consequences of Use: Personal relationships Substance #1 Name of Substance 1: Cocaine 1 - Age of First Use: 31 1 - Amount (size/oz): "less than a gram" 1 - Frequency: "not that much" 1 - Duration: off and on for several years 1 - Last Use / Amount: yesterday  CIWA: CIWA-Ar BP: (!) 130/49 Pulse Rate: 81 COWS:    PATIENT STRENGTHS: (choose at least two) Communication skills Physical Health Work skills  Allergies:  Allergies  Allergen Reactions   Codeine Nausea Only    Home Medications:  (  Not in a hospital admission)  OB/GYN Status:  No LMP for male patient.  General Assessment Data Location of Assessment: [facility] TTS Assessment: In system Is this a Tele or Face-to-Face Assessment?: Tele Assessment Is this an Initial Assessment or a Re-assessment for this encounter?: Initial Assessment Marital status: Married Is patient pregnant?: No Pregnancy Status: No Living Arrangements: Spouse/significant other, Children Can pt return to current living arrangement?: Yes Admission Status: Involuntary Is patient capable of signing voluntary admission?: No Referral Source: Self/Family/Friend Insurance type: none     Crisis Care Plan Living  Arrangements: Spouse/significant other, Children Name of Psychiatrist: none Name of Therapist: none  Education Status Is patient currently in school?: No Highest grade of school patient has completed: 8th  Risk to self with the past 6 months Suicidal Ideation: No Has patient been a risk to self within the past 6 months prior to admission? : No Suicidal Intent: No Has patient had any suicidal intent within the past 6 months prior to admission? : No Is patient at risk for suicide?: No Suicidal Plan?: No Has patient had any suicidal plan within the past 6 months prior to admission? : No Access to Means: No What has been your use of drugs/alcohol within the last 12 months?: reports to using cocaine yesterday Previous Attempts/Gestures: Yes How many times?: 1 Triggers for Past Attempts: Other (Comment) (Pt reports "life in general") Intentional Self Injurious Behavior: None Family Suicide History: No Recent stressful life event(s): Conflict (Comment), Other (Comment) (pt reports wife does not want him to go out and had him IVC) Persecutory voices/beliefs?: No Depression: No Depression Symptoms: Insomnia Substance abuse history and/or treatment for substance abuse?: No Suicide prevention information given to non-admitted patients: Not applicable  Risk to Others within the past 6 months Homicidal Ideation: No Does patient have any lifetime risk of violence toward others beyond the six months prior to admission? : Yes (comment) (pt in prison for assault in 2008) Thoughts of Harm to Others: No Current Homicidal Intent: No Current Homicidal Plan: No Access to Homicidal Means: No History of harm to others?: Yes Assessment of Violence: In distant past Violent Behavior Description: pt reports he was in prison for assault in 2008 but did not provide the details of the assault Does patient have access to weapons?: No Criminal Charges Pending?: No Does patient have a court date: No Is  patient on probation?: No  Psychosis Hallucinations: None noted Delusions: None noted  Mental Status Report Appearance/Hygiene: Disheveled, In scrubs Eye Contact: Good Motor Activity: Freedom of movement, Restlessness Speech: Logical/coherent Level of Consciousness: Alert Mood: Anxious, Apprehensive Affect: Anxious, Apprehensive Anxiety Level: Moderate Thought Processes: Coherent, Relevant Judgement: Partial Orientation: Person, Place, Time, Situation, Appropriate for developmental age Obsessive Compulsive Thoughts/Behaviors: None  Cognitive Functioning Concentration: Normal Memory: Recent Intact, Remote Intact IQ: Average Insight: Fair Impulse Control: Fair Appetite: Good Sleep: Decreased Total Hours of Sleep: 6 Vegetative Symptoms: None  ADLScreening [facility] Services) Patient's cognitive ability adequate to safely complete daily activities?: Yes Patient able to express need for assistance with ADLs?: Yes Independently performs ADLs?: Yes (appropriate for developmental age)  Prior Inpatient Therapy Prior Inpatient Therapy: Yes Prior Therapy Dates: 1991, 2005, 2012 [facility] Reason for Treatment: SI, Bipolar  Prior Outpatient Therapy Prior Outpatient Therapy: No Does patient have an ACCT team?: No Does patient have Intensive In-House Services?  : No Does patient have [facility] services? : No Does patient have P4CC services?: No  ADL Screening (condition  at time of admission) Patient's cognitive ability adequate to safely complete daily activities?: Yes Is the patient deaf or have difficulty hearing?: No Does the patient have difficulty seeing, even when wearing glasses/contacts?: No Does the patient have difficulty concentrating, remembering, or making decisions?: No Patient able to express need for assistance with ADLs?: Yes Does the patient have difficulty dressing or bathing?: No Independently performs  ADLs?: Yes (appropriate for developmental age) Does the patient have difficulty walking or climbing stairs?: No Weakness of Legs: None Weakness of Arms/Hands: None  Home Assistive Devices/Equipment Home Assistive Devices/Equipment: None    Abuse/Neglect Assessment (Assessment to be complete while patient is alone) Physical Abuse: Denies Verbal Abuse: Denies Sexual Abuse: Denies Exploitation of patient/patient's resources: Denies Self-Neglect: Denies     Merchant navy officerAdvance Directives (For Healthcare) Does Patient Have a Medical Advance Directive?: No Would patient like information on creating a medical advance directive?: No - Patient declined    Additional Information 1:1 In Past 12 Months?: No CIRT Risk: Yes Elopement Risk: No Does patient have medical clearance?: Yes     Disposition:  Disposition Initial Assessment Completed for this Encounter: Yes  Karolee Ohsquicha R Duff 05/05/2016 3:46 AM

## 2016-05-05 NOTE — Progress Notes (Signed)
Patient has been accepted at [facility], to Dr. Jeannine KittenFarah, call report at [phone]. Bed is ready and patient can arrive after 17:00 this evening 05/05/16. RN [name] has been informed. Writer attempted to inform pt's wife [name] [phone] of placement and left voicemail and call back number.  Pt's wife called back and was informed of pt's placement.  Pt's wife expressed thanks saying: "I was afraid that he will not get placed he is a smooth talker, thank you all for what you're doing".  Rodney Nguyen, LCSWA Disposition staff 05/05/2016 4:10 PM

## 2016-05-05 NOTE — BH Assessment (Signed)
This clinician spoke to patients wife, Oneal Groutshley Bogard [phone], who stated patient has a history of depression, suicidal thoughts and attempts, as well as, addictive behaviors.  Patient had been clean for a year but after a bug bite on the leg a month ago, he developed an infection and was given pain killers at the ED. Wife reports this seemed to be the beginning of his downward spiral.   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 He held out a knife yesterday in front of his wife and stated he didn't want to live anymore.  Wife reports she grabbed the knife and called patients sister. His sister would call the police.   Ms. Loreta AveMann reports a previous time when they were separated about 45 yrs old the patient called her making suicidal statements. She would later get a call that he attempted to drink a chemical in a suicide attempt. He was admitted to a facility in [place].   When the police arrived a their home yesterday the patient reportedly pulled out a knife in an attempt to force an attack by the police.  Wife reports he stated, "I'm not coming out of here alive. I don't want to live."  A friend was there in the home, was able to retreive the knife before the police came in.   Wife reports patient is bipolar but not on any medication at this time.

## 2016-05-05 NOTE — Progress Notes (Signed)
Writer called patient's wife to obtain collateral information and was able to leave voicemail.  Patient was recommended inpatient treatment per Fransisca KaufmannLaura Davis NP, on 05/05/16.  [facility], [facility], [facility].  CSW will continue to seek placement.  Melbourne Abtsatia Marshe Shrestha, LCSWA Disposition staff 05/05/2016 3:00 PM

## 2016-05-17 ENCOUNTER — Encounter (HOSPITAL_COMMUNITY): Payer: Self-pay

## 2016-05-17 ENCOUNTER — Emergency Department (HOSPITAL_COMMUNITY)
Admission: EM | Admit: 2016-05-17 | Discharge: 2016-05-17 | Disposition: A | Discharge status: Home / Self Care | Payer: Self-pay | Attending: Emergency Medicine | Admitting: Emergency Medicine

## 2016-05-17 DIAGNOSIS — Y929 Unspecified place or not applicable: Secondary | ICD-10-CM | POA: Insufficient documentation

## 2016-05-17 DIAGNOSIS — Z23 Encounter for immunization: Secondary | ICD-10-CM | POA: Insufficient documentation

## 2016-05-17 DIAGNOSIS — W57XXXA Bitten or stung by nonvenomous insect and other nonvenomous arthropods, initial encounter: Secondary | ICD-10-CM | POA: Insufficient documentation

## 2016-05-17 DIAGNOSIS — L0291 Cutaneous abscess, unspecified: Secondary | ICD-10-CM

## 2016-05-17 DIAGNOSIS — L02511 Cutaneous abscess of right hand: Secondary | ICD-10-CM | POA: Insufficient documentation

## 2016-05-17 DIAGNOSIS — F17210 Nicotine dependence, cigarettes, uncomplicated: Secondary | ICD-10-CM | POA: Insufficient documentation

## 2016-05-17 DIAGNOSIS — Y939 Activity, unspecified: Secondary | ICD-10-CM | POA: Insufficient documentation

## 2016-05-17 DIAGNOSIS — Y999 Unspecified external cause status: Secondary | ICD-10-CM | POA: Insufficient documentation

## 2016-05-17 MED ORDER — SULFAMETHOXAZOLE-TRIMETHOPRIM 800-160 MG PO TABS: 1.0000 | ORAL_TABLET | Freq: Two times a day (BID) | ORAL | 0 refills | Status: AC

## 2016-05-17 MED ORDER — SULFAMETHOXAZOLE-TRIMETHOPRIM 800-160 MG PO TABS
1.0000 | ORAL_TABLET | Freq: Once | ORAL | Status: AC
  Administered 2016-05-17: 1 via ORAL
  Filled 2016-05-17: qty 1

## 2016-05-17 MED ORDER — TETANUS-DIPHTH-ACELL PERTUSSIS 5-2.5-18.5 LF-MCG/0.5 IM SUSP
0.5000 mL | Freq: Once | INTRAMUSCULAR | Status: AC
  Administered 2016-05-17: 0.5 mL via INTRAMUSCULAR
  Filled 2016-05-17: qty 0.5

## 2016-05-17 MED ORDER — LIDOCAINE HCL (PF) 2 % IJ SOLN
2.0000 mL | Freq: Once | INTRAMUSCULAR | Status: AC
  Administered 2016-05-17: 2 mL
  Filled 2016-05-17: qty 10

## 2016-05-17 MED ORDER — HYDROCODONE-ACETAMINOPHEN 5-325 MG PO TABS: 1.0000 | ORAL_TABLET | ORAL | 0 refills | Status: AC | PRN

## 2016-05-17 NOTE — ED Provider Notes (Signed)
[facility] DEPT Provider Note   CSN: [id number] Arrival date & time: 05/17/16  1913     History   Chief Complaint Chief Complaint  Patient presents with  . Insect Bite    HPI Rodney Nguyen is a 45 y.o. male presenting an abscess to his right hand which he thought was simply a splinter when he first noticed a small tender raised "bump" 5 days ago.  He attempted to retrieve suspected foreign body by squeezing and digging using a needle and has now developed increased pain, swelling and infection.  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  He has applied neosporin to the site, applying hydrogen peroxide daily and hot water soaks but still with no spontaneous drainage.  The site is tender without radiation.  He denies other complaint.   The history is provided by the patient.    History reviewed. No pertinent past medical history.  Patient Active Problem List   Diagnosis Date Noted  . Cocaine abuse 05/05/2016    Past Surgical History:  Procedure Laterality Date  . fracture clavicle surgery    . KNEE SURGERY         Home Medications    Prior to Admission medications   Medication Sig Start Date End Date Taking? Authorizing Provider  acetaminophen (TYLENOL) 500 MG tablet Take 1,000 mg by mouth every 6 (six) hours as needed. Pain.    Historical Provider, MD  HYDROcodone-acetaminophen (NORCO/VICODIN) 5-325 MG tablet Take 1 tablet by mouth every 4 (four) hours as needed. 05/17/16   Burgess AmorJulie Maksymilian Mabey, PA-C  ibuprofen (ADVIL,MOTRIN) 200 MG tablet Take 800 mg by mouth every 6 (six) hours as needed. Pain.    Historical Provider, MD  sulfamethoxazole-trimethoprim (BACTRIM DS,SEPTRA DS) 800-160 MG tablet Take 1 tablet by mouth 2 (two) times daily. 05/17/16 05/27/16  Burgess AmorJulie Asier Desroches, PA-C    Family History No family history on file.  Social History Social History    Substance Use Topics  . Smoking status: Current Every Day Smoker    Packs/day: 1.00    Types: Cigarettes  . Smokeless tobacco: Never Used  . Alcohol use Yes     Comment: occasional     Allergies   Codeine   Review of Systems Review of Systems  Constitutional: Negative for chills and fever.  Respiratory: Negative for shortness of breath and wheezing.   Skin: Positive for wound.  Neurological: Negative for numbness.     Physical Exam Updated Vital Signs BP 127/89   Pulse 84   Temp 97.7 F (36.5 C)   Resp 18   Ht 6' (1.829 m)   Wt 81.6 kg   SpO2 99%   BMI 24.41 kg/m   Physical Exam  Constitutional: He appears well-developed and well-nourished. No distress.  HENT:  Head: Normocephalic.  Neck: Neck supple.  Cardiovascular: Normal rate.   Pulmonary/Chest: Effort normal. He has no wheezes.  Musculoskeletal: Normal range of motion. He exhibits no edema.  Skin: There is erythema.  1 cm raised non draining pustule with a 0.5 cm circle of erythema right dorsal hand mid fifth metacarpal.  No red streaking and no surrounding erythema.  Pt has FROM of digits without pain.      ED Treatments / Results  Labs (all labs ordered are listed, but only abnormal results are displayed) Labs Reviewed  AEROBIC CULTURE (SUPERFICIAL SPECIMEN)  EKG  EKG Interpretation None       Radiology No results found.  Procedures Procedures (including critical care time)  INCISION AND DRAINAGE Performed by: Burgess AmorIDOL, Rebekah Zackery Consent: Verbal consent obtained. Risks and benefits: risks, benefits and alternatives were discussed Type: abscess  Body area: right hand  Anesthesia: local infiltration  Incision was made with a scalpel.  Local anesthetic: lidocaine 2% without epinephrine  Anesthetic total: 2 ml  Complexity: complex Blunt dissection to break up loculations  Drainage: purulent  Drainage amount: small  Packing material: n/a  Patient tolerance: Patient tolerated  the procedure well with no immediate complications.     Medications Ordered in ED Medications  lidocaine (XYLOCAINE) 2 % injection 2 mL (2 mLs Other Given 05/17/16 1948)  Tdap (BOOSTRIX) injection 0.5 mL (0.5 mLs Intramuscular Given 05/17/16 1949)  sulfamethoxazole-trimethoprim (BACTRIM DS,SEPTRA DS) 800-160 MG per tablet 1 tablet (1 tablet Oral Given 05/17/16 2024)     Initial Impression / Assessment and Plan / ED Course  I have reviewed the triage vital signs and the nursing notes.  Pertinent labs & imaging results that were available during my care of the patient were reviewed by me and considered in my medical decision making (see chart for details).  Clinical Course     Bactrim, warm soaks, culture obtained.  Advised close watch for any signs of worsening infection or development of ulceration or eschar (none currently, making likelihood of brown recluse bite less likely).  Recheck here if not responding to tx within 48 hours.   Final Clinical Impressions(s) / ED Diagnoses   Final diagnoses:  Bug bite with infection, initial encounter  Abscess    New Prescriptions Discharge Medication List as of 05/17/2016  8:15 PM    START taking these medications   Details  sulfamethoxazole-trimethoprim (BACTRIM DS,SEPTRA DS) 800-160 MG tablet Take 1 tablet by mouth 2 (two) times daily., Starting Thu 05/17/2016, Until Sun 05/27/2016, Print         Burgess AmorJulie Chariah Bailey, PA-C 05/17/16 [id number]    Linwood DibblesJon Knapp, MD 05/21/16 1030

## 2016-05-17 NOTE — ED Triage Notes (Signed)
Saturday night I noticed a place on my right hand and thought I may have a splinter in it.  I tried to get it out and now it has got infection in it.  I believe that it is an spider bite instead.  Having a lot of pain in my right hand.

## 2016-05-17 NOTE — Discharge Instructions (Signed)
Take your entire course of the antibiotic prescribed.  Continue twice daily warm water soaks as instructed.  Keep your wound covered while at work.  You may take the hydrocodone prescribed for pain relief.  This will make you drowsy - do not drive within 4 hours of taking this medication.

## 2016-05-20 LAB — AEROBIC CULTURE W GRAM STAIN (SUPERFICIAL SPECIMEN)

## 2016-05-21 ENCOUNTER — Telehealth (HOSPITAL_BASED_OUTPATIENT_CLINIC_OR_DEPARTMENT_OTHER): Payer: Self-pay | Admitting: Emergency Medicine

## 2016-05-21 NOTE — Telephone Encounter (Signed)
Post ED Visit - Positive Culture Follow-up  Culture report reviewed by antimicrobial stewardship pharmacist:  [x]  Enzo BiNathan Batchelder, Pharm.D. []  Celedonio MiyamotoJeremy Frens, Pharm.D., BCPS []  Garvin FilaMike Maccia, Pharm.D. []  Georgina PillionElizabeth Martin, Pharm.D., BCPS []  OrwinMinh Pham, [address]Ph[address]D., BCPS, AAHIVP []  Estella HuskMichelle Turner, Pharm.D., BCPS, AAHIVP []  Tennis Mustassie Stewart, Pharm.D. []  Sherle Poeob Vincent, [address]Pharm[address]D.  Positive wound culture Treated with bactrim DS, organism sensitive to the same and no further patient follow-up is required at this time.  Berle MullMiller, Damondre Pfeifle 05/21/2016, 10:29 AM
